# Patient Record
Sex: Female | Born: 1965 | Race: White | Hispanic: No | Smoking: Current every day smoker
Health system: Southern US, Community
[De-identification: ages and names within clinical notes are randomized; demographics above are authoritative.]

## PROBLEM LIST (undated history)

## (undated) DIAGNOSIS — I4891 Unspecified atrial fibrillation: Secondary | ICD-10-CM

## (undated) HISTORY — PX: CHOLECYSTECTOMY: SHX55

---

## 2008-10-21 ENCOUNTER — Ambulatory Visit: Payer: Self-pay | Admitting: Family Medicine

## 2008-10-21 DIAGNOSIS — M79609 Pain in unspecified limb: Secondary | ICD-10-CM

## 2008-10-21 DIAGNOSIS — F411 Generalized anxiety disorder: Secondary | ICD-10-CM | POA: Insufficient documentation

## 2008-10-21 DIAGNOSIS — R51 Headache: Secondary | ICD-10-CM

## 2008-10-21 DIAGNOSIS — R519 Headache, unspecified: Secondary | ICD-10-CM | POA: Insufficient documentation

## 2009-01-28 ENCOUNTER — Ambulatory Visit: Payer: Self-pay | Admitting: Occupational Medicine

## 2009-05-01 ENCOUNTER — Ambulatory Visit: Payer: Self-pay | Admitting: Family Medicine

## 2010-04-15 NOTE — Assessment & Plan Note (Signed)
Summary: FEVER,ACHES X 2 DAYS/WB   Vital Signs:  Patient Profile:   45 Years Old Female CC:      Fever, cough, body aches x 3 days Height:     64 inches Weight:      209 pounds O2 Sat:      97 % O2 treatment:    Room Air Temp:     97.4 degrees F oral Pulse rate:   91 / minute Pulse rhythm:   regular Resp:     12 per minute BP sitting:   116 / 77  (right arm) Cuff size:   regular  Vitals Entered By: Avel Sensor, CMA                  Prior Medication List:  TOPAMAX 50 MG TABS (TOPIRAMATE)  CLONAZEPAM 0.5 MG TABS (CLONAZEPAM)  ZOMIG 5 MG TABS (ZOLMITRIPTAN)    Current Allergies (reviewed today): No known allergies History of Present Illness Chief Complaint: Fever, cough, body aches x 3 days History of Present Illness: Fever achy all over she feels miserable. She has had the flu vaccine. Her nephew whom she has been around has the flu. She dores smoke.  Current Problems: VIRAL INFECTION (ICD-079.99) FLU EXPOSURE (ICD-V01.9) LEG PAIN, LEFT (ICD-729.5) HEADACHE (ICD-784.0) ANXIETY (ICD-300.00)   Current Meds TOPAMAX 50 MG TABS (TOPIRAMATE)  CLONAZEPAM 0.5 MG TABS (CLONAZEPAM)  ZOMIG 5 MG TABS (ZOLMITRIPTAN)  MUCINEX DM 30-600 MG XR12H-TAB (DEXTROMETHORPHAN-GUAIFENESIN)  ADVIL 200 MG TABS (IBUPROFEN) 3-4 prn TUSSIONEX PENNKINETIC ER 8-10 MG/5ML LQCR (CHLORPHENIRAMINE-HYDROCODONE) sig 1 tsp by mouth twice aday as needed for cough MOBIC 7.5 MG TABS (MELOXICAM) sig 1 by mouth qday as needed for aches and pain TAMIFLU 75 MG CAPS (OSELTAMIVIR PHOSPHATE) Take one capsule by mouth twice a day TAMIFLU 75 MG CAPS (OSELTAMIVIR PHOSPHATE) Take one capsule by mouth twice a day  REVIEW OF SYSTEMS Constitutional Symptoms       Complains of fever.     Denies chills, night sweats, weight loss, weight gain, and fatigue.  Eyes       Complains of glasses.      Denies change in vision, eye pain, eye discharge, contact lenses, and eye surgery. Ear/Nose/Throat/Mouth       Complains  of sinus problems.      Denies hearing loss/aids, change in hearing, ear pain, ear discharge, dizziness, frequent runny nose, frequent nose bleeds, sore throat, hoarseness, and tooth pain or bleeding.  Respiratory       Complains of dry cough.      Denies productive cough, wheezing, shortness of breath, asthma, bronchitis, and emphysema/COPD.  Cardiovascular       Denies murmurs, chest pain, and tires easily with exhertion.    Gastrointestinal       Denies stomach pain, nausea/vomiting, diarrhea, constipation, blood in bowel movements, and indigestion. Genitourniary       Denies painful urination, kidney stones, and loss of urinary control. Neurological       Denies paralysis, seizures, and fainting/blackouts. Musculoskeletal       Complains of muscle pain.      Denies joint pain, joint stiffness, decreased range of motion, redness, swelling, muscle weakness, and gout.  Skin       Denies bruising, unusual mles/lumps or sores, and hair/skin or nail changes.  Psych       Denies mood changes, temper/anger issues, anxiety/stress, speech problems, depression, and sleep problems.  Past History:  Family History: Last updated: 05/01/2009 Adopt, unkown  Social History: Last  updated: 10/21/2008 Current Smoker Alcohol use-yes Drug use-no  Risk Factors: Smoking Status: current (10/21/2008)  Past Medical History: Reviewed history from 10/21/2008 and no changes required. Anxiety Headache  Past Surgical History: Reviewed history from 10/21/2008 and no changes required. Cholecystectomy  Family History: Reviewed history from 10/21/2008 and no changes required. Adopt, unkown  Social History: Reviewed history from 10/21/2008 and no changes required. Current Smoker Alcohol use-yes Drug use-no Physical Exam General appearance: well developed, well nourished, mmild  distress Head: normocephalic, atraumatic Ears: normal, no lesions or deformities Nasal: pale, boggy, swollen nasal  turbinates Oral/Pharynx: pharyngeal erythema without exudate, uvula midline without deviation Chest/Lungs: no rales, wheezes, or rhonchi bilateral, breath sounds equal without effort Heart: regular rate and  rhythm, no murmur Skin: no obvious rashes or lesions MSE: oriented to time, place, and person Assessment New Problems: VIRAL INFECTION (ICD-079.99) FLU EXPOSURE (ICD-V01.9)  flu exposure and probably the flu despite negative flu test  Patient Education: Patient and/or caregiver instructed in the following: rest fluids and Tylenol, quit smoking.  Plan New Medications/Changes: TAMIFLU 75 MG CAPS (OSELTAMIVIR PHOSPHATE) Take one capsule by mouth twice a day  #10 x 0, 05/01/2009, Hassan Rowan MD MOBIC 7.5 MG TABS (MELOXICAM) sig 1 by mouth qday as needed for aches and pain  #30 x 0, 05/01/2009, Hassan Rowan MD Sandria Senter ER 8-10 MG/5ML LQCR (CHLORPHENIRAMINE-HYDROCODONE) sig 1 tsp by mouth twice aday as needed for cough  #72floz x 0, 05/01/2009, Hassan Rowan MD  New Orders: Flu A+B [47425] New Patient Level III [95638] Planning Comments:   as below  Follow Up: Follow up in 2-3 days if no improvement Work/School Excuse: Return to work/school in 3 days  The patient and/or caregiver has been counseled thoroughly with regard to medications prescribed including dosage, schedule, interactions, rationale for use, and possible side effects and they verbalize understanding.  Diagnoses and expected course of recovery discussed and will return if not improved as expected or if the condition worsens. Patient and/or caregiver verbalized understanding.  Prescriptions: TAMIFLU 75 MG CAPS (OSELTAMIVIR PHOSPHATE) Take one capsule by mouth twice a day  #10 x 0   Entered and Authorized by:   Hassan Rowan MD   Signed by:   Hassan Rowan MD on 05/01/2009   Method used:   Print then Give to Patient   RxID:   [id number] MOBIC 7.5 MG TABS (MELOXICAM) sig 1 by mouth qday as needed for aches  and pain  #30 x 0   Entered and Authorized by:   Hassan Rowan MD   Signed by:   Hassan Rowan MD on 05/01/2009   Method used:   Print then Give to Patient   RxID:   [id number] TUSSIONEX PENNKINETIC ER 8-10 MG/5ML LQCR (CHLORPHENIRAMINE-HYDROCODONE) sig 1 tsp by mouth twice aday as needed for cough  #5floz x 0   Entered and Authorized by:   Hassan Rowan MD   Signed by:   Hassan Rowan MD on 05/01/2009   Method used:   Print then Give to Patient   RxID:   [id number]   Patient Instructions: 1)  Tobacco is very bad for your health and your loved ones! You Should stop smoking!. 2)  Stop Smoking Tips: Choose a Quit date. Cut down before the Quit date. decide what you will do as a substitute when you feel the urge to smoke(gum,toothpick,exercise). 3)  Recommended remaining out of work for next 2 days. 4)  Please schedule a follow-up appointment as needed. 5)  Please schedule an  appointment with your primary doctor in :3-10 days

## 2010-04-15 NOTE — Letter (Signed)
Summary: Out of Work  [facility]  [address]   [place], [place] [address]   Phone: [phone]  Fax: [phone]    May 01, 2009   Employee:  Katie Lin    To Whom It May Concern:   For Medical reasons, please excuse the above named employee from work for the following dates:  Start:   05/01/2009  End:   05/04/2009  If you need additional information, please feel free to contact our office.         Sincerely,    Hassan Rowan MD

## 2020-03-22 ENCOUNTER — Emergency Department (HOSPITAL_BASED_OUTPATIENT_CLINIC_OR_DEPARTMENT_OTHER): Payer: Commercial Managed Care - PPO

## 2020-03-22 ENCOUNTER — Encounter (HOSPITAL_BASED_OUTPATIENT_CLINIC_OR_DEPARTMENT_OTHER): Payer: Self-pay

## 2020-03-22 ENCOUNTER — Emergency Department (HOSPITAL_BASED_OUTPATIENT_CLINIC_OR_DEPARTMENT_OTHER)
Admission: EM | Admit: 2020-03-22 | Discharge: 2020-03-22 | Disposition: A | Discharge status: Home / Self Care | Payer: Commercial Managed Care - PPO | Attending: Emergency Medicine | Admitting: Emergency Medicine

## 2020-03-22 DIAGNOSIS — W548XXA Other contact with dog, initial encounter: Secondary | ICD-10-CM | POA: Diagnosis not present

## 2020-03-22 DIAGNOSIS — S81851A Open bite, right lower leg, initial encounter: Secondary | ICD-10-CM | POA: Diagnosis not present

## 2020-03-22 DIAGNOSIS — F17210 Nicotine dependence, cigarettes, uncomplicated: Secondary | ICD-10-CM | POA: Insufficient documentation

## 2020-03-22 DIAGNOSIS — S8991XA Unspecified injury of right lower leg, initial encounter: Secondary | ICD-10-CM | POA: Diagnosis present

## 2020-03-22 DIAGNOSIS — Z23 Encounter for immunization: Secondary | ICD-10-CM | POA: Diagnosis not present

## 2020-03-22 DIAGNOSIS — S81831A Puncture wound without foreign body, right lower leg, initial encounter: Secondary | ICD-10-CM | POA: Diagnosis not present

## 2020-03-22 DIAGNOSIS — W540XXA Bitten by dog, initial encounter: Secondary | ICD-10-CM

## 2020-03-22 HISTORY — DX: Unspecified atrial fibrillation: I48.91

## 2020-03-22 MED ORDER — AMOXICILLIN-POT CLAVULANATE 875-125 MG PO TABS: 1.0000 | ORAL_TABLET | Freq: Two times a day (BID) | ORAL | 0 refills | Status: AC

## 2020-03-22 MED ORDER — TETANUS-DIPHTH-ACELL PERTUSSIS 5-2.5-18.5 LF-MCG/0.5 IM SUSY
0.5000 mL | PREFILLED_SYRINGE | Freq: Once | INTRAMUSCULAR | Status: AC
  Administered 2020-03-22: 0.5 mL via INTRAMUSCULAR
  Filled 2020-03-22: qty 0.5

## 2020-03-22 MED ORDER — AMOXICILLIN-POT CLAVULANATE 875-125 MG PO TABS
1.0000 | ORAL_TABLET | Freq: Once | ORAL | Status: AC
  Administered 2020-03-22: 1 via ORAL
  Filled 2020-03-22: qty 1

## 2020-03-22 NOTE — Discharge Instructions (Addendum)
Your history and exam are consistent with multiple small puncture wounds to your right calf from the dog bite earlier this morning.  There was no evidence of debris in the wounds and no evidence of fracture underlying or foreign bodies.  As we discussed and confirmed, as this is a domestic dog that is in custody, we do not feel you need the rabies postexposure prophylaxis at this time.  Please follow-up with the animal control to make sure the dog does not have any symptoms over the next 10 days.  If so, please return to get the injections we discussed.  Please take the antibiotics to prevent bacterial infection and we updated your tetanus as well.  Please use RICE therapy with rest, ice, and elevation to help with the discomfort and pain.  If any symptoms change or worsen or start to look like an infection, please return to the nearest emergency department.

## 2020-03-22 NOTE — ED Triage Notes (Signed)
Pt states she was outside sweeping leaves at 0600 this morning when the neighbors dog bit her on the R calf. Pt states she is unsure is the vaccinated or not. PT states she has already reported to animal control.

## 2020-03-22 NOTE — ED Provider Notes (Signed)
[facility] EMERGENCY DEPARTMENT Provider Note   CSN: [id number] Arrival date & time: 03/22/20  6314     History Chief Complaint  Patient presents with  . Animal Bite    Katie Lin is a 55 y.o. female.  The history is provided by the patient and medical records. No language interpreter was used.  Animal Bite Contact animal:  Dog Location:  Leg Leg injury location:  R lower leg Time since incident:  6 hours Pain details:    Quality:  Aching and sore   Severity:  Moderate Provoked: provoked (dogs were barking at each other)   Notifications:  Animal control (by patiet) Animal's rabies vaccination status:  Unknown Animal in possession: yes   Tetanus status:  Out of date Relieved by:  Nothing Worsened by:  Nothing Ineffective treatments:  None tried Associated symptoms: swelling   Associated symptoms: no fever, no numbness and no rash        Past Medical History:  Diagnosis Date  . Atrial fibrillation [facility])     Patient Active Problem List   Diagnosis Date Noted  . ANXIETY 10/21/2008  . LEG PAIN, LEFT 10/21/2008  . HEADACHE 10/21/2008    Past Surgical History:  Procedure Laterality Date  . CHOLECYSTECTOMY       OB History   No obstetric history on file.     History reviewed. No pertinent family history.  Social History   Tobacco Use  . Smoking status: Current Every Day Smoker    Packs/day: 0.50    Types: Cigarettes  Substance Use Topics  . Alcohol use: Yes    Comment: socially  . Drug use: Never    Home Medications Prior to Admission medications   Not on File    Allergies    Erythromycin  Review of Systems   Review of Systems  Constitutional: Negative for chills, diaphoresis, fatigue and fever.  HENT: Negative for congestion.   Eyes: Negative for visual disturbance.  Respiratory: Negative for cough, chest tightness, shortness of breath and wheezing.   Cardiovascular: Negative for chest pain, palpitations and leg  swelling.  Gastrointestinal: Negative for abdominal pain.  Genitourinary: Negative for flank pain.  Musculoskeletal: Negative for back pain and neck pain.  Skin: Positive for wound. Negative for rash.  Neurological: Negative for numbness.  All other systems reviewed and are negative.   Physical Exam Updated Vital Signs BP (!) 156/103 (BP Location: Right Arm)   Pulse 94   Temp 98.4 F (36.9 C) (Oral)   Resp 18   Ht 5\' 4"  (1.626 m)   Wt 104.3 kg   SpO2 96%   BMI 39.48 kg/m   Physical Exam Vitals and nursing note reviewed.  Constitutional:      General: She is not in acute distress.    Appearance: She is well-developed and well-nourished. She is not ill-appearing, toxic-appearing or diaphoretic.  HENT:     Head: Normocephalic and atraumatic.     Right Ear: External ear normal.     Left Ear: External ear normal.     Nose: Nose normal.     Mouth/Throat:     Mouth: Oropharynx is clear and moist.  Eyes:     Extraocular Movements: EOM normal.     Conjunctiva/sclera: Conjunctivae normal.     Pupils: Pupils are equal, round, and reactive to light.  Cardiovascular:     Rate and Rhythm: Normal rate.     Pulses: Normal pulses.     Heart sounds: No murmur  heard.   Pulmonary:     Effort: No respiratory distress.     Breath sounds: No stridor. No wheezing, rhonchi or rales.  Chest:     Chest wall: No tenderness.  Abdominal:     General: There is no distension.     Tenderness: There is no abdominal tenderness. There is no rebound.  Musculoskeletal:        General: Swelling, tenderness and signs of injury present.     Cervical back: Normal range of motion and neck supple.     Right lower leg: No edema.     Left lower leg: No edema.       Legs:     Comments: Normal gait.  Normal strength and sensation distally to the wounds.  Normal DP and PT pulse on exam. Exam otherwise unremarkable.    Skin:    General: Skin is warm.     Capillary Refill: Capillary refill takes less  than 2 seconds.     Coloration: Skin is not pale.     Findings: No erythema or rash.  Neurological:     Mental Status: She is alert and oriented to person, place, and time.     Sensory: No sensory deficit.     Motor: No weakness or abnormal muscle tone.     Coordination: Coordination normal.     Deep Tendon Reflexes: Reflexes are normal and symmetric.  Psychiatric:        Mood and Affect: Mood normal.     ED Results / Procedures / Treatments   Labs (all labs ordered are listed, but only abnormal results are displayed) Labs Reviewed - No data to display  EKG None  Radiology DG Tibia/Fibula Right  Result Date: 03/22/2020 CLINICAL DATA:  Right posterior calf puncture wound from a dog bite. Clinical concern for a foreign body or tooth fragment. EXAM: RIGHT TIBIA AND FIBULA - 2 VIEW COMPARISON:  None. FINDINGS: Soft tissue swelling in the posterior subcutaneous fat at the level of the mid lower leg. No soft tissue gas, fracture or radiopaque foreign body seen. IMPRESSION: Posterior soft tissue swelling without fracture or radiopaque foreign body. Electronically Signed   By: Beckie Salts M.D.   On: 03/22/2020 12:38    Procedures Procedures (including critical care time)  Medications Ordered in ED Medications  Tdap (BOOSTRIX) injection 0.5 mL (0.5 mLs Intramuscular Given 03/22/20 1239)  amoxicillin-clavulanate (AUGMENTIN) 875-125 MG per tablet 1 tablet (1 tablet Oral Given 03/22/20 1238)    ED Course  I have reviewed the triage vital signs and the nursing notes.  Pertinent labs & imaging results that were available during my care of the patient were reviewed by me and considered in my medical decision making (see chart for details).    MDM Rules/Calculators/A&P                          Katie Lin is a 55 y.o. female with a past medical history significant for prior cholecystectomy and A. fib who presents with a dog bite on her right calf.  According the patient, she took her  dog out to go the bathroom this morning around 6 AM.  She then reports the neighbor dog started barking at her dog and she got her dog back inside.  When she went back out to start sweeping some leaves, the neighbor dog came and bit her calf.  She sustained approximately 10 small puncture wounds to her right calf  and had a small amount of bleeding.  It is still sore.  Patient went to urgent care where she was seen and then quickly told to come to this emergency department for evaluation and management.  Patient reports the neighbor dog has been taken to animal control but the neighbor says that the dog was vaccinated against rabies but the vet could not find that documentation.  Dog was taken into custody with poison control.  On exam, patient does have approximately 10 small puncture bites on her right calf.  She had intact sensation and strength distally.  She had some tenderness around the area but no further bleeding.  Intact DP and PT pulse distally.  No knee tenderness or hip tenderness.  Lungs clear chest and back nontender.  Patient resting comfortably otherwise.  She was able to ambulate.  Had a long conversation with the patient about management.  We will get x-ray to look for tooth fragments or foreign body or underlying fracture.  I suspect primarily soft tissue injury.  We will update the patient's tetanus as she think she is out of date with her tetanus.  As the dog is in custody and it was provoked by an altercation with her dog, we discussed that per the CDC guidelines and protocol, we would not start postexposure prophylaxis for rabies at this time.  The dog will be quarantined for the next 10 days and monitor for symptoms, if symptoms develop, patient need to return to get started on the postexposure prophylaxis for rabies protocol.  We will also start the patient on antibiotics with Augmentin for antibiotic prophylaxis as well.  Anticipate reassessment after x-ray.  1:33 PM X-ray shows no  foreign body, fracture, or underlying injury.  Suspect soft tissue injuries.  No fragments of the dog tooth seen.  Patient given dose of Augmentin and Tdap.  We had another discussion about the protocols that she does not appear to need postexposure prophylaxis as the dog is in custody and can be monitored as a domestic animal for the next 10 days.  If any symptoms were to change or worsen, patient knows to return.  She agrees with plan of care.  Patient discharged in good condition with understanding of plan of care from here.   Final Clinical Impression(s) / ED Diagnoses Final diagnoses:  Dog bite, initial encounter  Puncture wound of right calf    Rx / DC Orders ED Discharge Orders         Ordered    amoxicillin-clavulanate (AUGMENTIN) 875-125 MG tablet  Every 12 hours        03/22/20 1327         Clinical Impression: 1. Dog bite, initial encounter   2. Puncture wound of right calf     Disposition: Discharge  Condition: Good  I have discussed the results, Dx and Tx plan with the pt(& family if present). He/she/they expressed understanding and agree(s) with the plan. Discharge instructions discussed at great length. Strict return precautions discussed and pt &/or family have verbalized understanding of the instructions. No further questions at time of discharge.    New Prescriptions   AMOXICILLIN-CLAVULANATE (AUGMENTIN) 875-125 MG TABLET    Take 1 tablet by mouth every 12 (twelve) hours for 7 days.    Follow Up: Your PCP     [facility] EM[facility]GENCY DEPARTMENT [address] [id number] [place] [address] [place] [address] [phone]       Willa Brocks, Canary Brim, MD 03/22/20 [phone]

## 2022-01-31 IMAGING — DX DG TIBIA/FIBULA 2V*R*
4 series · 4 of 4 positions shown · non-contrast
Comparison: None.

CLINICAL DATA: Right posterior calf puncture wound from a dog bite.
Clinical concern for a foreign body or tooth fragment.

EXAM:
RIGHT TIBIA AND FIBULA - 2 VIEW

[tibia ap (1 of 2)]
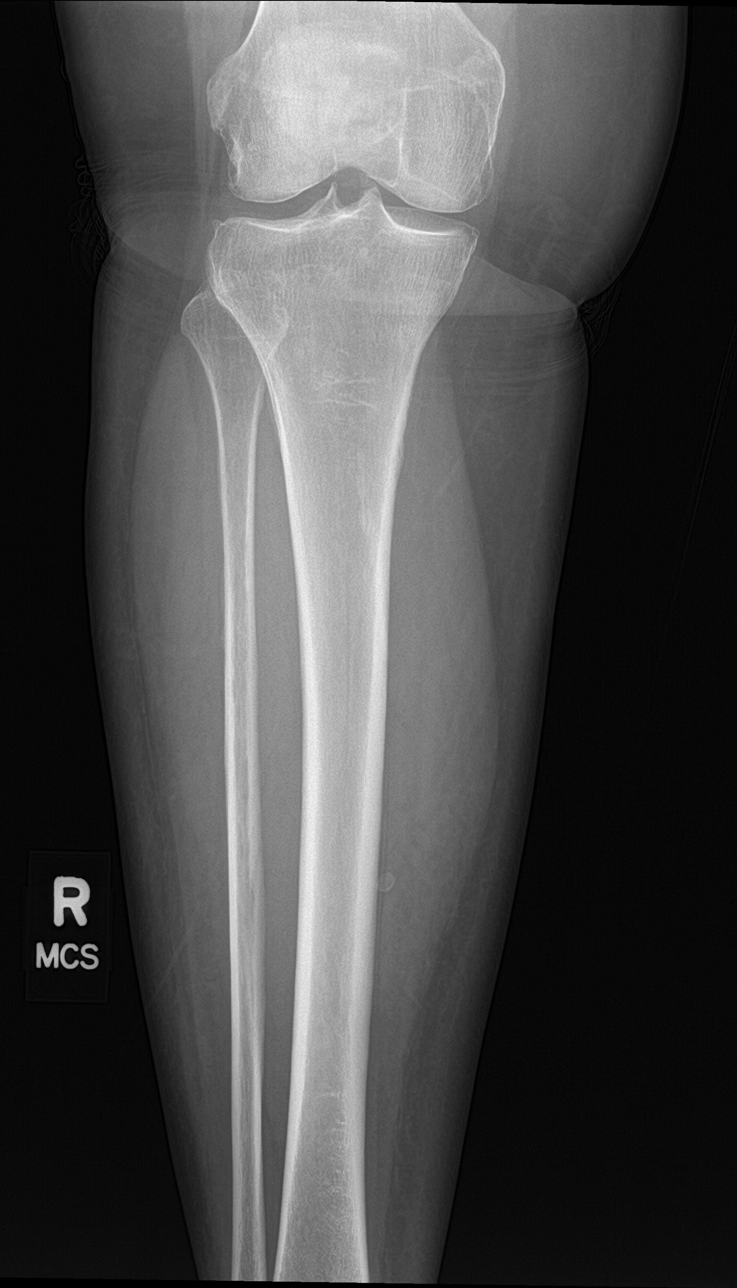

[tibia ap (2 of 2)]
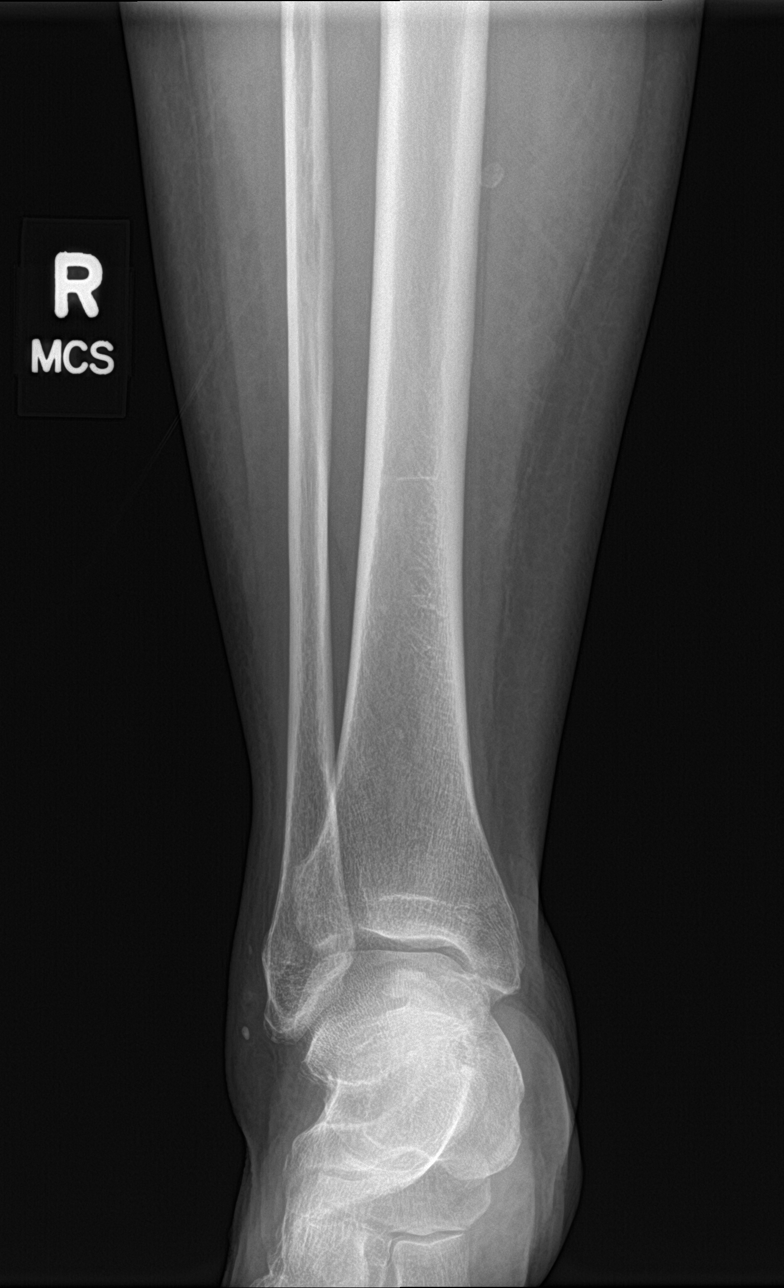

[tibia lat (1 of 2)]
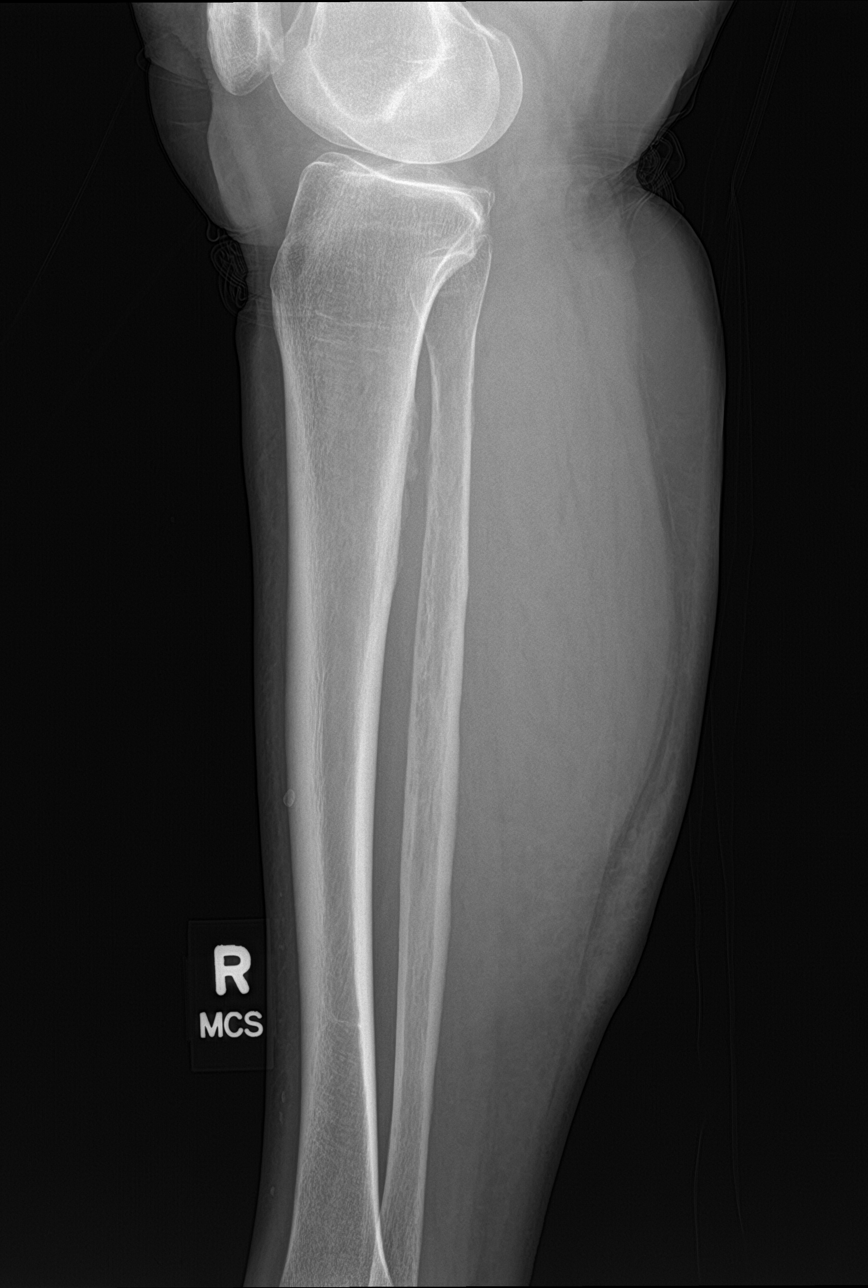

[tibia lat (2 of 2)]
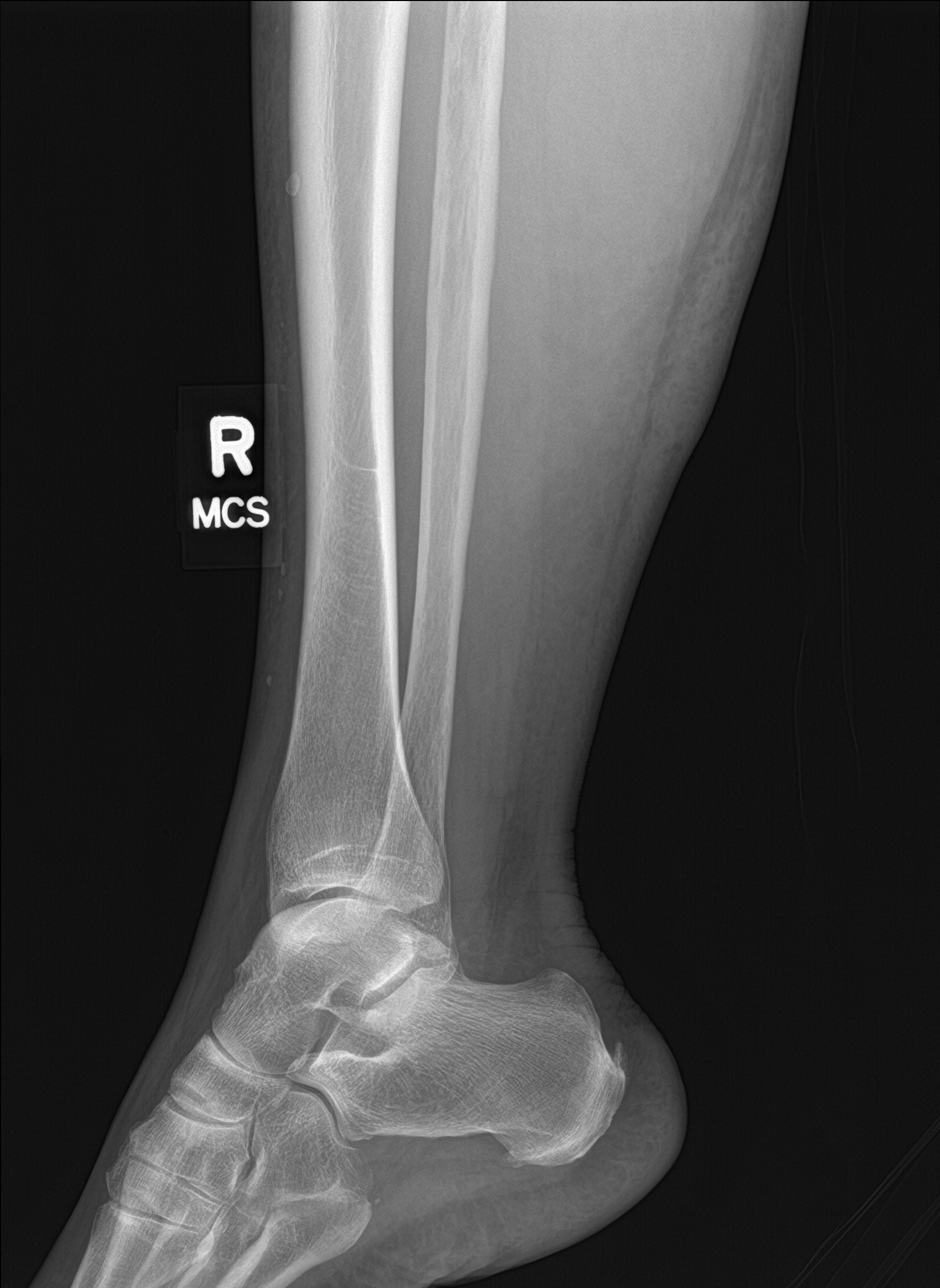

[4 of 4 positions shown; findings below may reference images not displayed]

FINDINGS: Soft tissue swelling in the posterior subcutaneous fat at the level
of the mid lower leg. No soft tissue gas, fracture or radiopaque
foreign body seen.
IMPRESSION: Posterior soft tissue swelling without fracture or radiopaque
foreign body.
